# Patient Record
Sex: Male | Born: 1995 | Race: White | Hispanic: No | Marital: Single | Smoking: Current every day smoker
Health system: Southern US, Community
[De-identification: ages and names within clinical notes are randomized; demographics above are authoritative.]

---
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
## 2017-05-16 ENCOUNTER — Other Ambulatory Visit: Payer: Self-pay

## 2017-05-16 ENCOUNTER — Encounter (HOSPITAL_BASED_OUTPATIENT_CLINIC_OR_DEPARTMENT_OTHER): Payer: Self-pay

## 2017-05-16 ENCOUNTER — Emergency Department (HOSPITAL_BASED_OUTPATIENT_CLINIC_OR_DEPARTMENT_OTHER)
Admission: EM | Admit: 2017-05-16 | Discharge: 2017-05-16 | Disposition: A | Discharge status: Home / Self Care | Payer: Self-pay | Attending: Emergency Medicine | Admitting: Emergency Medicine

## 2017-05-16 DIAGNOSIS — R1032 Left lower quadrant pain: Secondary | ICD-10-CM | POA: Insufficient documentation

## 2017-05-16 DIAGNOSIS — F17290 Nicotine dependence, other tobacco product, uncomplicated: Secondary | ICD-10-CM | POA: Insufficient documentation

## 2017-05-16 NOTE — ED Triage Notes (Signed)
Pt c/o left groin x 1 week-states he lifts everyday for work-unknown specific injury gait

## 2017-05-16 NOTE — Discharge Instructions (Signed)
You can take Tylenol or Ibuprofen as directed for pain. You can alternate Tylenol and Ibuprofen every 4 hours. If you take Tylenol at 1pm, then you can take Ibuprofen at 5pm. Then you can take Tylenol again at 9pm.   As we discussed, there may be a small hernia noted on the left side but is easily reducible.  [facility] that will help the pain when you work or when you are doing heavy lifting.  Monitor this area closely and return the emergency department for any redness, swelling, warmth of the site.  As we discussed, this also could be a groin strain given your distribution of pain.  Return the emergency department for any fever, worsening pain, redness or swelling of the groin, testicular swelling, pain or any other worsening or concerning symptoms.

## 2017-05-16 NOTE — ED Provider Notes (Signed)
[facility] EMERGENCY DEPARTMENT Provider Note   CSN: [id number] Arrival date & time: 05/16/17  1926     History   Chief Complaint Chief Complaint  Patient presents with  . Groin Pain    HPI [place] is a 22 y.o. male left groin pain that is been ongoing for the last week.  Patient states that he has diffuse pain in the left inguinal region.  He states he occasionally will have some pain that extends into the medial aspect of the right thigh.  He denies any preceding trauma, injury.  He states that he does have some mild worsening pain when working and lifting heavy things.  Patient states that he does lift and push heavy boxes during work.  Patient denies any fever, testicular pain or swelling, penile pain or swelling.  The history is provided by the patient.    History reviewed. No pertinent past medical history.  Patient Active Problem List   Diagnosis Date Noted  . Acute appendicitis 12/08/2015    Past Surgical History:  Procedure Laterality Date  . LAPAROSCOPIC APPENDECTOMY N/A 12/09/2015   Procedure: APPENDECTOMY LAPAROSCOPIC;  Surgeon: Berna Buehelsea A Connor, MD;  Location: [facility];  Service: General;  Laterality: N/A;       Home Medications    Prior to Admission medications   Not on File    Family History No family history on file.  Social History Social History   Tobacco Use  . Smoking status: Current Every Day Smoker    Types: E-cigarettes  . Smokeless tobacco: Never Used  Substance Use Topics  . Alcohol use: Yes    Comment: occ  . Drug use: Yes    Types: Marijuana     Allergies   Patient has no known allergies.   Review of Systems Review of Systems  Constitutional: Negative for fever.  Genitourinary: Negative for difficulty urinating, dysuria, hematuria, penile swelling, scrotal swelling and testicular pain.       Groin pain     Physical Exam Updated Vital Signs BP (!) 127/58 (BP Location: Right Arm)   Pulse 67   Temp  98.3 F (36.8 C) (Oral)   Resp 20   Wt 71 kg (156 lb 8 oz)   SpO2 100%   BMI 25.26 kg/m   Physical Exam  Constitutional: He appears well-developed and well-nourished.  HENT:  Head: Normocephalic and atraumatic.  Eyes: Conjunctivae and EOM are normal. Right eye exhibits no discharge. Left eye exhibits no discharge. No scleral icterus.  Pulmonary/Chest: Effort normal.  Abdominal: Soft. Normal appearance. There is no tenderness. A hernia is present. Hernia confirmed positive in the left inguinal area. Hernia confirmed negative in the right inguinal area.  Abdomen is soft, nondistended, nontender.  Genitourinary: Testes normal and penis normal. Right testis shows no mass and no tenderness. Left testis shows no mass and no tenderness. Circumcised.  Genitourinary Comments: Small, easily reducible inguinal hernia on the left side.  No overlying warmth, erythema.  Musculoskeletal:  Patient does have some mild tenderness noted to the left inguinal region that extends into the right medial thigh.  No deformity or crepitus noted.  No overlying warmth, erythema.  Full range of motion of left upper extremity without any difficulty.  No pain with internal or external rotation of left hip.  No tenderness palpation to the left hip.  Neurological: He is alert.  Skin: Skin is warm and dry.  Psychiatric: He has a normal mood and affect. His speech is normal and  behavior is normal.  Nursing note and vitals reviewed.    ED Treatments / Results  Labs (all labs ordered are listed, but only abnormal results are displayed) Labs Reviewed - No data to display  EKG  EKG Interpretation None       Radiology No results found.  Procedures Procedures (including critical care time)  Medications Ordered in ED Medications - No data to display   Initial Impression / Assessment and Plan / ED Course  I have reviewed the triage vital signs and the nursing notes.  Pertinent labs & imaging results that  were available during my care of the patient were reviewed by me and considered in my medical decision making (see chart for details).     22 y.o. M who presents for evaluation of left groin pain times 1 week.  No preceding trauma, injury but does report the pain is worsened when he lifts heavy things at work.  No fevers, testicular pain, swelling.  Also reports some tightness/pain noted to the medial thigh that is connected with the groin pain. Patient is afebrile, non-toxic appearing, sitting comfortably on examination table. Vital signs reviewed and stable.  GU exam no penile or testicular abnormalities.  Patient does have a small, easily reducible left-sided inguinal hernia with no overlying warmth, erythema.  Does not appear strangulated.  This could be contributing to patient's pain.  No indication for emergent treatment at this time.  I discussed supportive measures with patient.  Additionally, patient could be suffering from groin strain given he is having some muscular pain into the medial thigh on the same side.  Patient encouraged with supportive undergarments to help with symptoms while working.  Additionally instructed patient to take NSAIDs to help with muscular pain.  Encourage primary care follow-up in the next few days for further evaluation. Patient had ample opportunity for questions and discussion. All patient's questions were answered with full understanding. Strict return precautions discussed. Patient expresses understanding and agreement to plan.   Final Clinical Impressions(s) / ED Diagnoses   Final diagnoses:  Left inguinal pain    ED Discharge Orders    None       Rosana Hoes 05/17/17 Marda Stalker, MD 05/17/17 [phone]

## 2017-11-08 IMAGING — CT CT ABD-PELV W/ CM
2 of 4 series · 16 of 46 positions shown, 18 images · IV contrast (APPLIED)
Comparison: None.

CLINICAL DATA: Right lower quadrant pain since this morning.

EXAM:
CT ABDOMEN AND PELVIS WITH CONTRAST
TECHNIQUE: Multidetector CT imaging of the abdomen and pelvis was performed
using the standard protocol following bolus administration of
intravenous contrast.
CONTRAST:  100mL 5PKMYH-7GG IOPAMIDOL (5PKMYH-7GG) INJECTION 61%

[Series 2: axial st · axial · 0.77mm/px · z∈[-438,-8]mm · 13 of 94 slices shown, 15 images]
[im 4/94  soft-tissue]
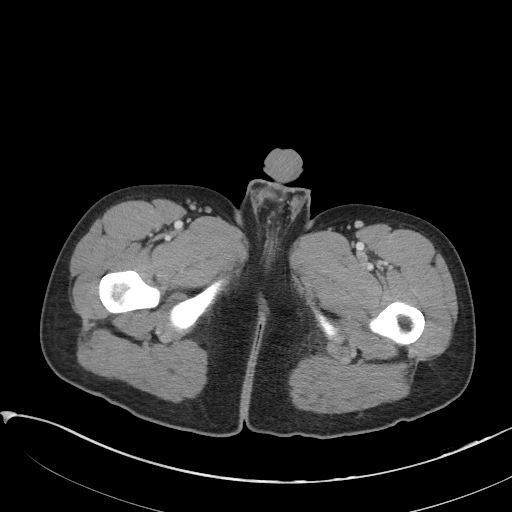
[im 4/94  bone]
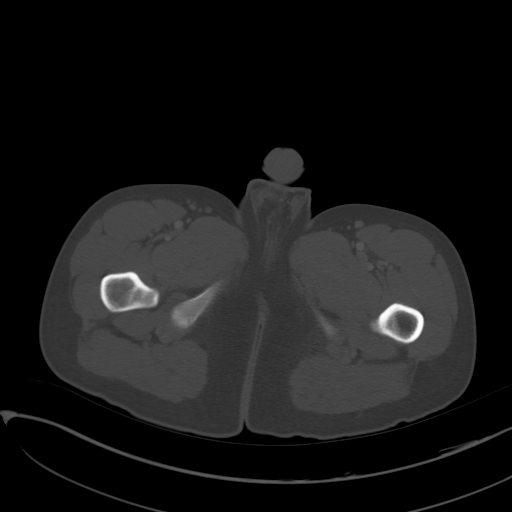
[im 12/94  soft-tissue]
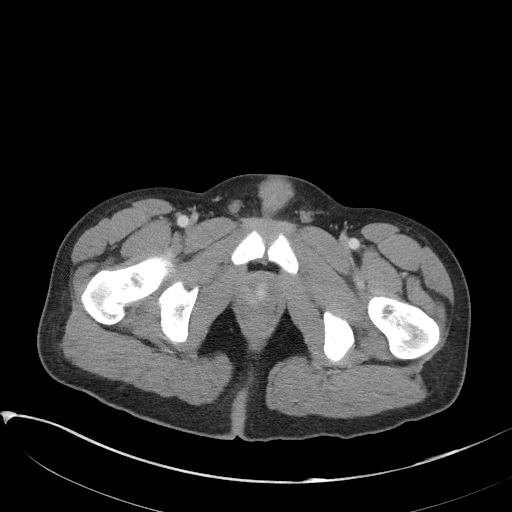
[im 20/94  soft-tissue]
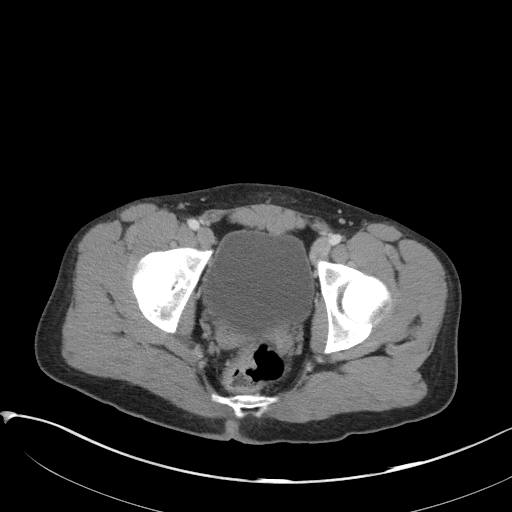
[im 28/94  soft-tissue]
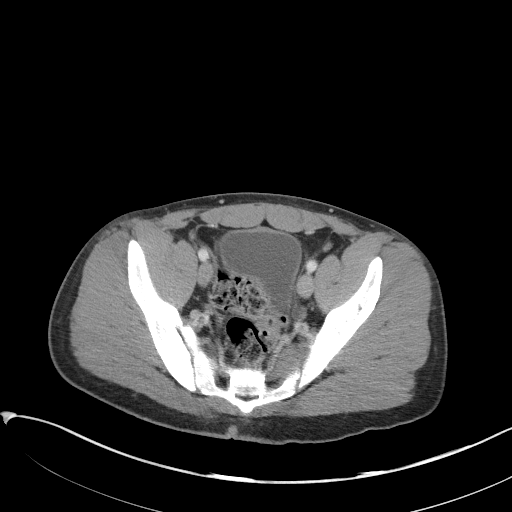
[im 32/94  soft-tissue]
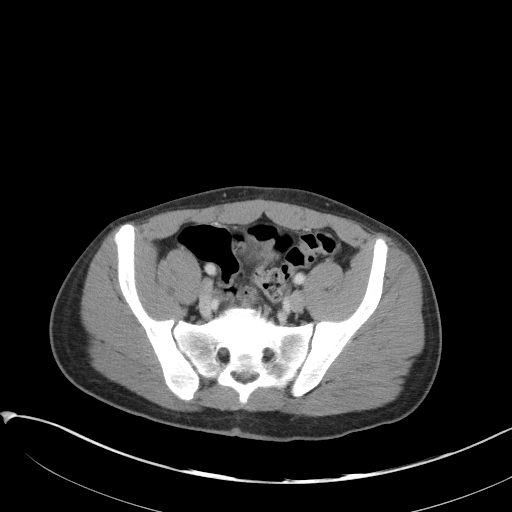
[im 39/94  soft-tissue]
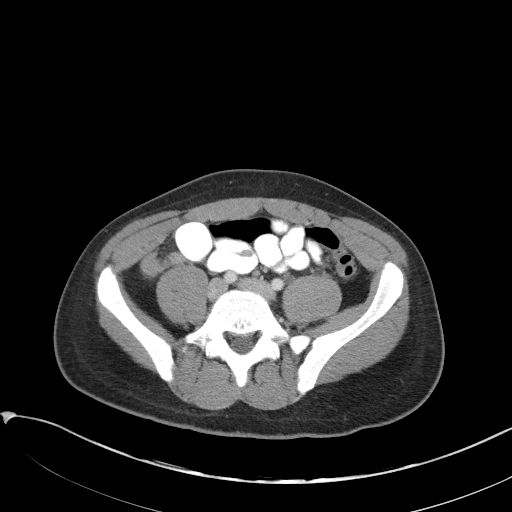
[im 47/94  soft-tissue]
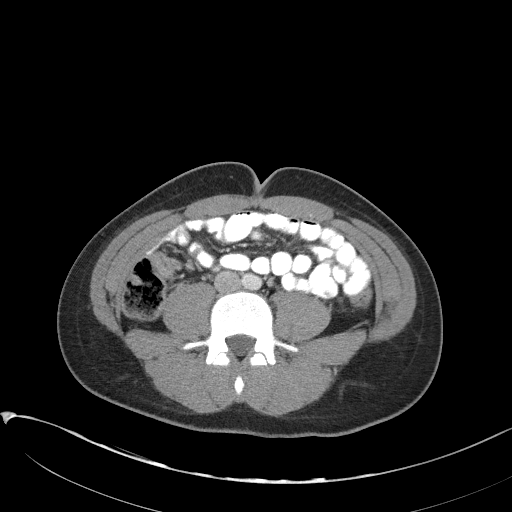
[im 55/94  soft-tissue]
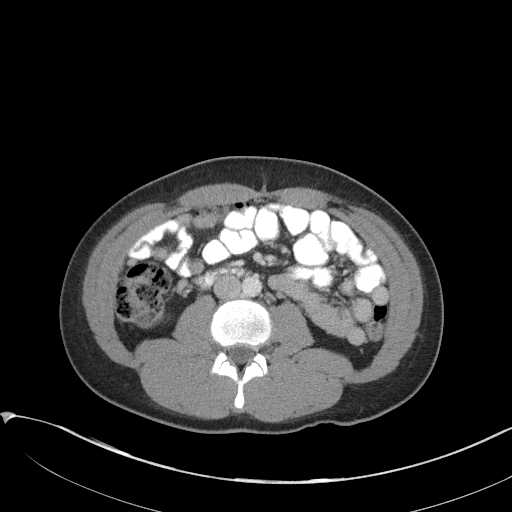
[im 63/94  soft-tissue]
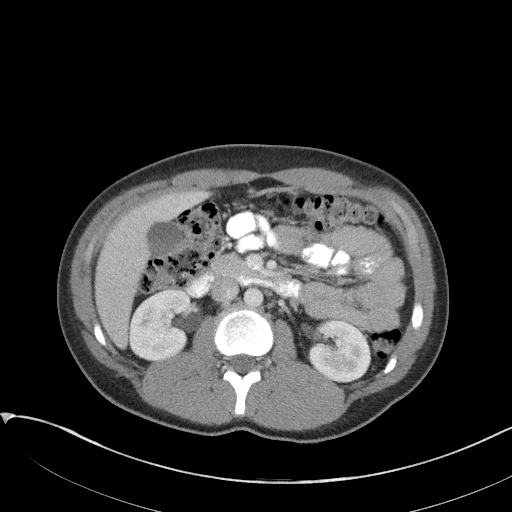
[im 63/94  bone]
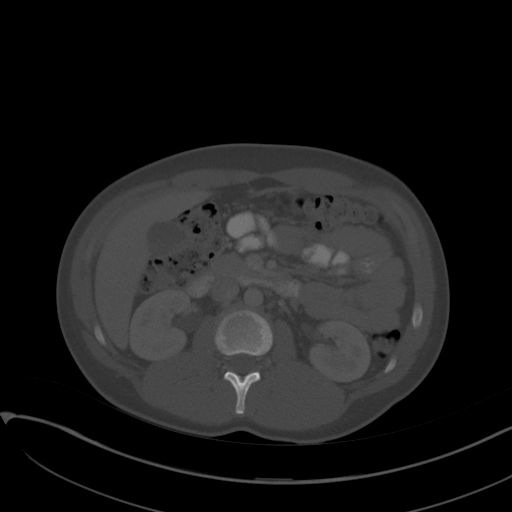
[im 66/94  soft-tissue]
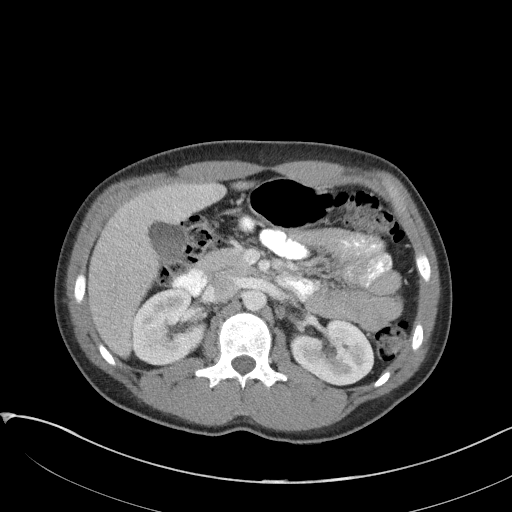
[im 74/94  soft-tissue]
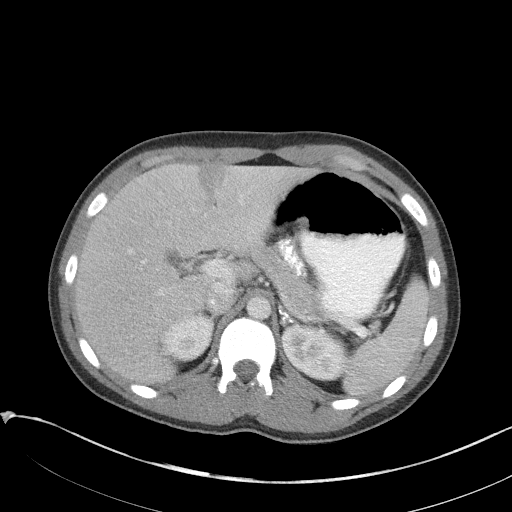
[im 82/94  soft-tissue]
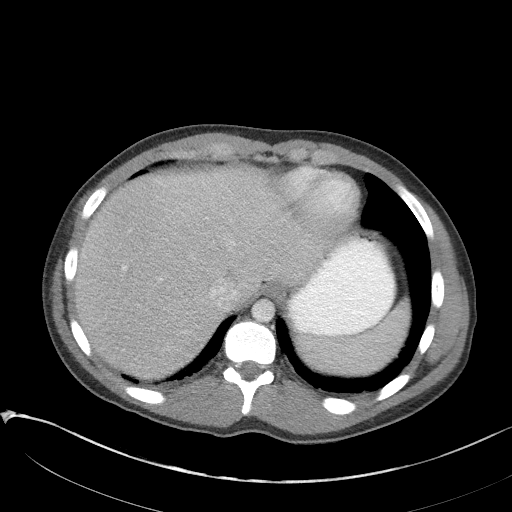
[im 90/94  soft-tissue]
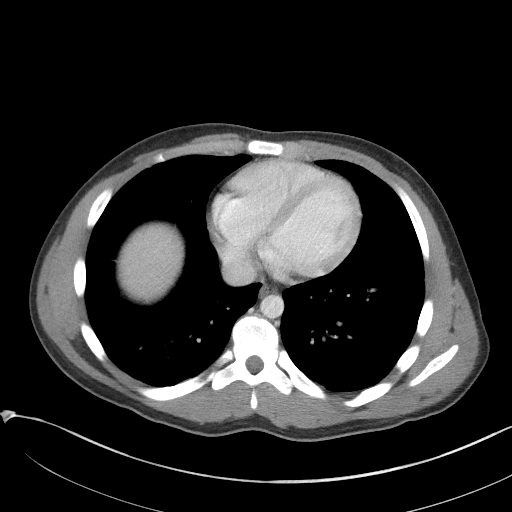

[Series 5: coronal st · coronal · 0.83mm/px · 3 of 79 slices shown]
[im 27/79  soft-tissue]
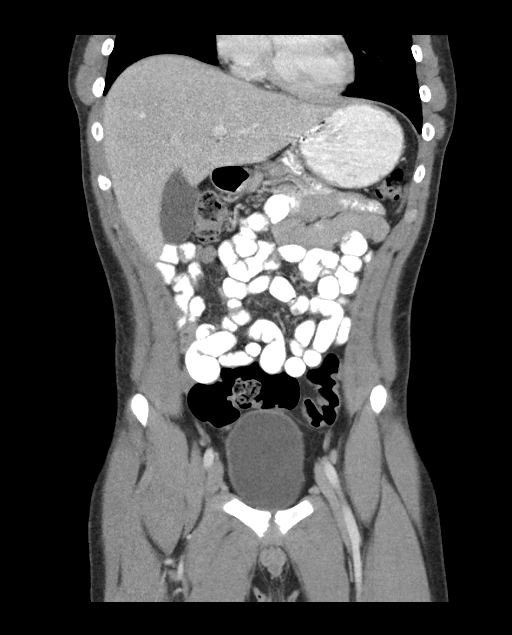
[im 35/79  soft-tissue]
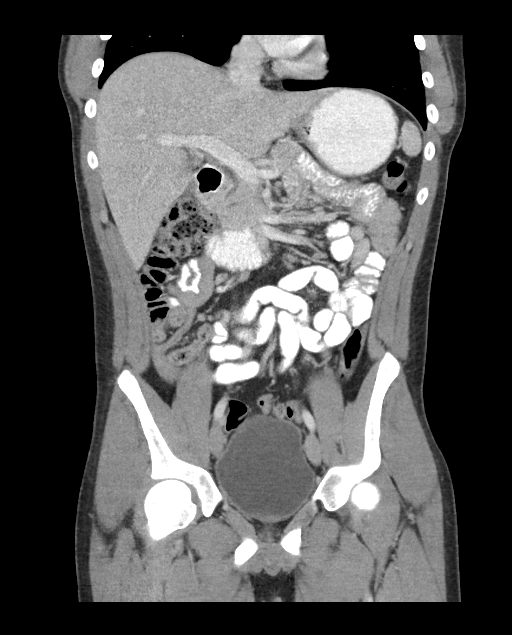
[im 44/79  soft-tissue]
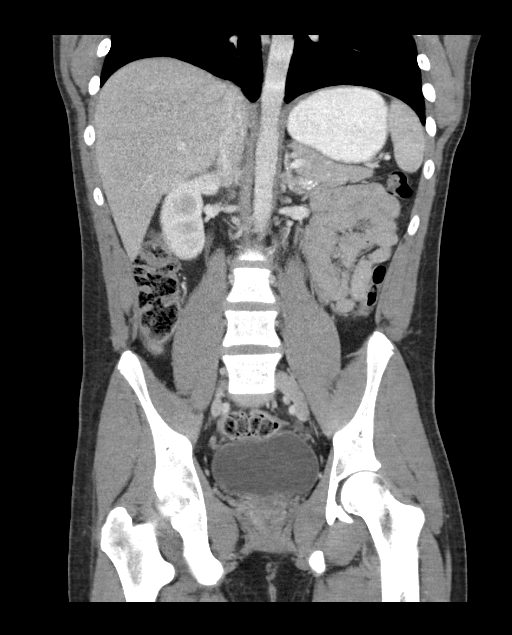

[16 of 46 positions shown; findings below may reference images not displayed]

FINDINGS: Lower chest: No acute abnormality.

Hepatobiliary: No focal liver abnormality is seen. No gallstones,
gallbladder wall thickening, or biliary dilatation.

Pancreas: Unremarkable. No pancreatic ductal dilatation or
surrounding inflammatory changes.

Spleen: Normal in size without focal abnormality.

Adrenals/Urinary Tract: Adrenal glands are unremarkable. Kidneys are
normal, without renal calculi, focal lesion, or hydronephrosis.
Bladder is unremarkable.

Stomach/Bowel: The stomach is within normal limits. The small bowel
loops have a normal course and caliber. No obstruction. Normal
appearance of the colon. The appendix appears retrocecal in
location. Although normal by size criteria measuring 6 mm in
diameter there is mild periappendiceal indistinctness/fat stranding.
No free fluid and no fluid collections identified.

Vascular/Lymphatic: Normal appearance of the abdominal aorta. No
enlarged retroperitoneal or mesenteric adenopathy. No enlarged
pelvic or inguinal lymph nodes.

Reproductive: Normal appearance of the prostate gland

Other: No free fluid or fluid collections.

Musculoskeletal: No acute or significant osseous findings.
IMPRESSION: 1. Examination is equivocal for acute appendicitis. Although the
appendix is normal by size criteria there is mild periappendiceal
indistinctness/fat stranding. Cannot rule out early appendicitis.
Careful clinical correlation advised.
